# Patient Record
Sex: Female | Born: 1980 | Race: White | Hispanic: No | Marital: Married | Smoking: Current some day smoker
Health system: Southern US, Community
[De-identification: ages and names within clinical notes are randomized; demographics above are authoritative.]

## PROBLEM LIST (undated history)

## (undated) DIAGNOSIS — F419 Anxiety disorder, unspecified: Secondary | ICD-10-CM

## (undated) DIAGNOSIS — F329 Major depressive disorder, single episode, unspecified: Secondary | ICD-10-CM

## (undated) DIAGNOSIS — F32A Depression, unspecified: Secondary | ICD-10-CM

## (undated) DIAGNOSIS — I83813 Varicose veins of bilateral lower extremities with pain: Secondary | ICD-10-CM

## (undated) HISTORY — DX: Varicose veins of bilateral lower extremities with pain: I83.813

## (undated) HISTORY — PX: HERNIA REPAIR: SHX51

## (undated) HISTORY — DX: Major depressive disorder, single episode, unspecified: F32.9

## (undated) HISTORY — DX: Anxiety disorder, unspecified: F41.9

## (undated) HISTORY — PX: TONSILLECTOMY: SUR1361

## (undated) HISTORY — DX: Depression, unspecified: F32.A

---

## 2016-02-26 ENCOUNTER — Other Ambulatory Visit: Payer: Self-pay | Admitting: Adult Health

## 2016-02-26 ENCOUNTER — Ambulatory Visit
Admission: RE | Admit: 2016-02-26 | Discharge: 2016-02-26 | Disposition: A | Discharge status: Home / Self Care | Source: Ambulatory Visit | Attending: Adult Health | Admitting: Adult Health

## 2016-02-26 DIAGNOSIS — M7989 Other specified soft tissue disorders: Secondary | ICD-10-CM | POA: Insufficient documentation

## 2016-04-08 ENCOUNTER — Ambulatory Visit (INDEPENDENT_AMBULATORY_CARE_PROVIDER_SITE_OTHER): Admitting: Vascular Surgery

## 2016-04-08 ENCOUNTER — Encounter (INDEPENDENT_AMBULATORY_CARE_PROVIDER_SITE_OTHER): Payer: Self-pay

## 2016-04-08 ENCOUNTER — Encounter (INDEPENDENT_AMBULATORY_CARE_PROVIDER_SITE_OTHER): Payer: Self-pay | Admitting: Vascular Surgery

## 2016-04-08 VITALS — BP 107/54 | HR 64 | Resp 17 | Ht 62.0 in | Wt 171.0 lb

## 2016-04-08 DIAGNOSIS — I83813 Varicose veins of bilateral lower extremities with pain: Secondary | ICD-10-CM | POA: Diagnosis not present

## 2016-04-08 DIAGNOSIS — F17200 Nicotine dependence, unspecified, uncomplicated: Secondary | ICD-10-CM

## 2016-04-08 DIAGNOSIS — M79609 Pain in unspecified limb: Secondary | ICD-10-CM | POA: Insufficient documentation

## 2016-04-08 DIAGNOSIS — M79604 Pain in right leg: Secondary | ICD-10-CM

## 2016-04-08 NOTE — Assessment & Plan Note (Signed)
The patient has symptoms consistent with chronic venous insufficiency. We discussed the natural history and treatment options for venous disease. I recommended the regular use of 20 - 30 mm Hg compression stockings, and prescribed these today. I recommended leg elevation and anti-inflammatories as needed for pain. I have also recommended a complete venous duplex to assess the venous system for reflux or thrombotic issues if the pain worsens. This can be done at the patient's convenience. I will see the patient back in 3 months to assess the response to conservative management, and determine further treatment options.

## 2016-04-08 NOTE — Progress Notes (Signed)
Patient ID: Andrea CashingSarah Adams, female   DOB: 03/18/1981, 35 y.o.   MRN: [id number]  Chief Complaint  Patient presents with  . New Patient (Initial Visit)    HPI Andrea CashingSarah Adams is a 35 y.o. female.  I am asked to see the patient by Dr. Olena Leatherwoodornetto for evaluation of pain in the legs with varicose veins.  The patient presents with complaints of symptomatic varicosities of the legs. The patient reports a long standing history of varicosities and they have become painful over time. There was no clear inciting event or causative factor that started the symptoms.  The right leg is more severly affected. The patient elevates the legs for relief. The pain is described as aching and heaviness. The symptoms are generally most severe in the evening, particularly when they have been on their feet for long periods of time. Of note, she was recently on several medications which were stopped which seems to have improved her lower extremity symptoms. She had a venous duplex to make sure she did not have a DVT recently, and this was negative. The patient has no previous history of deep venous thrombosis or superficial thrombophlebitis to their knowledge.     History reviewed. No pertinent past medical history.  Past Surgical History:  Procedure Laterality Date  . HERNIA REPAIR    . TONSILLECTOMY      Family History  Problem Relation Age of Onset  . Varicose Veins Mother   . Cancer Father   . AAA (abdominal aortic aneurysm) Father   . Cerebrovascular Accident Maternal Grandfather      Social History Social History  Substance Use Topics  . Smoking status: Current Some Day Smoker    Types: Cigarettes  . Smokeless tobacco: Current User  . Alcohol use Yes     No Known Allergies  Current Outpatient Prescriptions  Medication Sig Dispense Refill  . acetaminophen (TYLENOL) 500 MG tablet Take by mouth as needed.    . Multiple Vitamin (MULTIVITAMIN) LIQD Take 5 mLs by mouth daily.     No current  facility-administered medications for this visit.       REVIEW OF SYSTEMS (Negative unless checked)  Constitutional: [] Weight loss  [] Fever  [] Chills Cardiac: [] Chest pain   [] Chest pressure   [] Palpitations   [] Shortness of breath when laying flat   [] Shortness of breath at rest   [] Shortness of breath with exertion. Vascular:  [] Pain in legs with walking   [] Pain in legs at rest   [] Pain in legs when laying flat   [] Claudication   [] Pain in feet when walking  [] Pain in feet at rest  [] Pain in feet when laying flat   [] History of DVT   [] Phlebitis   [x] Swelling in legs   [x] Varicose veins   [] Non-healing ulcers Pulmonary:   [] Uses home oxygen   [] Productive cough   [] Hemoptysis   [] Wheeze  [] COPD   [] Asthma Neurologic:  [] Dizziness  [] Blackouts   [] Seizures   [] History of stroke   [] History of TIA  [] Aphasia   [] Temporary blindness   [] Dysphagia   [] Weakness or numbness in arms   [] Weakness or numbness in legs Musculoskeletal:  [] Arthritis   [] Joint swelling   [] Joint pain   [] Low back pain Hematologic:  [] Easy bruising  [] Easy bleeding   [] Hypercoagulable state   [] Anemic  [] Hepatitis Gastrointestinal:  [] Blood in stool   [] Vomiting blood  [] Gastroesophageal reflux/heartburn   [] Abdominal pain Genitourinary:  [] Chronic kidney disease   [] Difficult urination  [] Frequent urination  []   Burning with urination   [] Hematuria Skin:  [] Rashes   [] Ulcers   [] Wounds Psychological:  [] History of anxiety   []  History of major depression.    Physical Exam BP (!) 107/54   Pulse 64   Resp 17   Ht 5\' 2"  (1.575 m)   Wt 77.6 kg (171 lb)   BMI 31.28 kg/m  Gen:  WD/WN, NAD Head: [place]/AT, No temporalis wasting.  Ear/Nose/Throat: Hearing grossly intact, dentition  Eyes: Sclera non-icteric. Conjunctiva clear Neck: Supple, no nuchal rigidity. Trachea midline Pulmonary:  Good air movement, no use of accessory muscles, respirations not labored.  Cardiac: RRR, No JVD Vascular: Varicosities in the right  medial calf are the most prominent and measuring up to 4-5 mm in the right lower extremity        Varicosities scattered and measuring up to 2-3 mm in the left lower extremity Vessel Right Left  Radial Palpable Palpable  Ulnar Palpable Palpable  Brachial Palpable Palpable  Carotid Palpable, without bruit Palpable, without bruit  Aorta Not palpable N/A  Femoral Palpable Palpable  Popliteal Palpable Palpable  PT Palpable Palpable  DP Palpable Palpable   Gastrointestinal: soft, non-tender/non-distended. No guarding/reflex. No masses, surgical incisions, or scars. Musculoskeletal: M/S 5/5 throughout.  No significant lower extremity edema Neurologic: CN 2-12 intact. Pain and light touch intact in extremities.  Symmetrical.  Speech is fluent.  Psychiatric: Judgment intact, Mood & affect appropriate for pt's clinical situation. Dermatologic: No rashes or ulcers noted.  No cellulitis or open wounds. Lymph : No Cervical, Axillary, or Inguinal lymphadenopathy.   Radiology No results found.  Labs No results found for this or any previous visit (from the past 2160 hour(s)).  Assessment/Plan:  Pain in limb Improved after stopping several medications.  Varicose veins of bilateral lower extremities with pain The patient has symptoms consistent with chronic venous insufficiency. We discussed the natural history and treatment options for venous disease. I recommended the regular use of 20 - 30 mm Hg compression stockings, and prescribed these today. I recommended leg elevation and anti-inflammatories as needed for pain. I have also recommended a complete venous duplex to assess the venous system for reflux or thrombotic issues if the pain worsens. This can be done at the patient's convenience. I will see the patient back in 3 months to assess the response to conservative management, and determine further treatment options.  As her symptoms have improved with cessation of several medications, we're  going to give her a trial of several months with conservative measures to see if her symptoms worsen or improve before proceeding with any invasive treatment or further workup with duplex. She will call my office with any worsening symptoms in the interim.       Festus Barren 04/08/2016, 4:54 PM   This note was created with Dragon medical transcription system.  Any errors from dictation are unintentional.

## 2016-04-08 NOTE — Assessment & Plan Note (Signed)
Improved after stopping several medications.

## 2016-04-08 NOTE — Patient Instructions (Signed)
Varicose Veins Varicose veins are veins that have become enlarged and twisted. They are usually seen in the legs but can occur in other parts of the body as well. CAUSES This condition is the result of valves in the veins not working properly. Valves in the veins help to return blood from the leg to the heart. If these valves are damaged, blood flows backward and backs up into the veins in the leg near the skin. This causes the veins to become larger. RISK FACTORS People who are on their feet a lot, who are pregnant, or who are overweight are more likely to develop varicose veins. SIGNS AND SYMPTOMS  Bulging, twisted-appearing, bluish veins, most commonly found on the legs.  Leg pain or a feeling of heaviness. These symptoms may be worse at the end of the day.  Leg swelling.  Changes in skin color. DIAGNOSIS A health care provider can usually diagnose varicose veins by examining your legs. Your health care provider may also recommend an ultrasound of your leg veins. TREATMENT Most varicose veins can be treated at home.However, other treatments are available for people who have persistent symptoms or want to improve the cosmetic appearance of the varicose veins. These treatment options include:  Sclerotherapy. A solution is injected into the vein to close it off.  Laser treatment. A laser is used to heat the vein to close it off.  Radiofrequency vein ablation. An electrical current produced by radio waves is used to close off the vein.  Phlebectomy. The vein is surgically removed through small incisions made over the varicose vein.  Vein ligation and stripping. The vein is surgically removed through incisions made over the varicose vein after the vein has been tied (ligated). HOME CARE INSTRUCTIONS  Do not stand or sit in one position for long periods of time. Do not sit with your legs crossed. Rest with your legs raised during the day.  Wear compression stockings as directed by your  health care provider. These stockings help to prevent blood clots and reduce swelling in your legs.  Do not wear other tight, encircling garments around your legs, pelvis, or waist.  Walk as much as possible to increase blood flow.  Raise the foot of your bed at night with 2-inch blocks.  If you get a cut in the skin over the vein and the vein bleeds, lie down with your leg raised and press on it with a clean cloth until the bleeding stops. Then place a bandage (dressing) on the cut. See your health care provider if it continues to bleed. SEEK MEDICAL CARE IF:  The skin around your ankle starts to break down.  You have pain, redness, tenderness, or hard swelling in your leg over a vein.  You are uncomfortable because of leg pain.   This information is not intended to replace advice given to you by your health care provider. Make sure you discuss any questions you have with your health care provider.   Document Released: 03/23/2005 Document Revised: 07/04/2014 Document Reviewed: 10/29/2013 Elsevier Interactive Patient Education 2016 [employer]  

## 2016-06-03 ENCOUNTER — Ambulatory Visit (INDEPENDENT_AMBULATORY_CARE_PROVIDER_SITE_OTHER): Admitting: Vascular Surgery

## 2016-06-24 ENCOUNTER — Encounter (INDEPENDENT_AMBULATORY_CARE_PROVIDER_SITE_OTHER): Payer: Self-pay | Admitting: Vascular Surgery

## 2016-06-24 ENCOUNTER — Ambulatory Visit (INDEPENDENT_AMBULATORY_CARE_PROVIDER_SITE_OTHER): Admitting: Vascular Surgery

## 2016-06-24 VITALS — BP 106/65 | HR 67 | Resp 17 | Wt 177.0 lb

## 2016-06-24 DIAGNOSIS — M79604 Pain in right leg: Secondary | ICD-10-CM

## 2016-06-24 DIAGNOSIS — I83811 Varicose veins of right lower extremities with pain: Secondary | ICD-10-CM | POA: Diagnosis not present

## 2016-06-24 NOTE — Assessment & Plan Note (Signed)
Suspicious for venous disease.  See plan as below

## 2016-06-24 NOTE — Assessment & Plan Note (Signed)
No improvement with conservative management. Would like to proceed with a venous duplex for further evaluation and planning of treatment for venous disease.

## 2016-06-24 NOTE — Progress Notes (Signed)
MRN : [id number]  Andrea Adams is a 35 y.o. (06/28/1980) female who presents with chief complaint of  Chief Complaint  Patient presents with  . Follow-up  .  History of Present Illness: Patient returns today in follow up of Leg pain and venous disease. She reports that she has been trying to wear her compression stockings, elevating her legs, exercising and doing all things that we asked her to do at her first visit about 10 weeks ago. Her right leg is not really any better and the veins are still just as prominent. She now wants further evaluation for her venous disease.   History reviewed. No pertinent past medical history.       Past Surgical History:  Procedure Laterality Date  . HERNIA REPAIR    . TONSILLECTOMY           Family History  Problem Relation Age of Onset  . Varicose Veins Mother   . Cancer Father   . AAA (abdominal aortic aneurysm) Father   . Cerebrovascular Accident Maternal Grandfather      Social History       Social History  Substance Use Topics  . Smoking status: Current Some Day Smoker    Types: Cigarettes  . Smokeless tobacco: Current User  . Alcohol use Yes      No Known Allergies        Current Outpatient Prescriptions  Medication Sig Dispense Refill  . acetaminophen (TYLENOL) 500 MG tablet Take by mouth as needed.    . Multiple Vitamin (MULTIVITAMIN) LIQD Take 5 mLs by mouth daily.     No current facility-administered medications for this visit.       REVIEW OF SYSTEMS (Negative unless checked)  Constitutional: [] Weight loss  [] Fever  [] Chills Cardiac: [] Chest pain   [] Chest pressure   [] Palpitations   [] Shortness of breath when laying flat   [] Shortness of breath at rest   [] Shortness of breath with exertion. Vascular:  [] Pain in legs with walking   [] Pain in legs at rest   [] Pain in legs when laying flat   [] Claudication   [] Pain in feet when walking  [] Pain in feet at rest  [] Pain in feet when  laying flat   [] History of DVT   [] Phlebitis   [x] Swelling in legs   [x] Varicose veins   [] Non-healing ulcers Pulmonary:   [] Uses home oxygen   [] Productive cough   [] Hemoptysis   [] Wheeze  [] COPD   [] Asthma Neurologic:  [] Dizziness  [] Blackouts   [] Seizures   [] History of stroke   [] History of TIA  [] Aphasia   [] Temporary blindness   [] Dysphagia   [] Weakness or numbness in arms   [] Weakness or numbness in legs Musculoskeletal:  [] Arthritis   [] Joint swelling   [] Joint pain   [] Low back pain Hematologic:  [] Easy bruising  [] Easy bleeding   [] Hypercoagulable state   [] Anemic  [] Hepatitis Gastrointestinal:  [] Blood in stool   [] Vomiting blood  [] Gastroesophageal reflux/heartburn   [] Abdominal pain Genitourinary:  [] Chronic kidney disease   [] Difficult urination  [] Frequent urination  [] Burning with urination   [] Hematuria Skin:  [] Rashes   [] Ulcers   [] Wounds Psychological:  [] History of anxiety   []  History of major depression.    Physical Exam BP (!) 107/54   Pulse 64   Resp 17   Ht 5\' 2"  (1.575 m)   Wt 77.6 kg (171 lb)   BMI 31.28 kg/m  Gen:  WD/WN, NAD Head: [place]/AT, No temporalis wasting.  Ear/Nose/Throat: Hearing grossly intact, dentition  Eyes: Sclera non-icteric. Conjunctiva clear Neck: Supple, no nuchal rigidity. Trachea midline Pulmonary:  Good air movement, no use of accessory muscles, respirations not labored.  Cardiac: RRR, No JVD Vascular: Varicosities in the right medial calf are the most prominent and measuring up to 4-5 mm in the right lower extremity                   Varicosities scattered and measuring up to 2-3 mm in the left lower extremity Vessel Right Left  Radial Palpable Palpable  Ulnar Palpable Palpable  Brachial Palpable Palpable  Carotid Palpable, without bruit Palpable, without bruit  Aorta Not palpable N/A  Femoral Palpable Palpable  Popliteal Palpable Palpable  PT Palpable Palpable  DP Palpable Palpable   Gastrointestinal: soft,  non-tender/non-distended. No guarding/reflex. No masses, surgical incisions, or scars. Musculoskeletal: M/S 5/5 throughout.  No significant lower extremity edema Neurologic: CN 2-12 intact. Pain and light touch intact in extremities.  Symmetrical.  Speech is fluent.  Psychiatric: Judgment intact, Mood & affect appropriate for pt's clinical situation. Dermatologic: No rashes or ulcers noted.  No cellulitis or open wounds. Lymph : No Cervical, Axillary, or Inguinal lymphadenopathy.     Labs No results found for this or any previous visit (from the past 2160 hour(s)).  Radiology No results found.   Assessment/Plan  Pain in limb Suspicious for venous disease.  See plan as below  Varicose veins of leg with pain, right No improvement with conservative management. Would like to proceed with a venous duplex for further evaluation and planning of treatment for venous disease.    Festus BarrenJason Kieryn Burtis, MD  06/24/2016 4:52 PM    This note was created with Dragon medical transcription system.  Any errors from dictation are purely unintentional

## 2016-07-20 ENCOUNTER — Ambulatory Visit (INDEPENDENT_AMBULATORY_CARE_PROVIDER_SITE_OTHER)

## 2016-07-20 ENCOUNTER — Ambulatory Visit (INDEPENDENT_AMBULATORY_CARE_PROVIDER_SITE_OTHER): Admitting: Vascular Surgery

## 2016-07-20 ENCOUNTER — Encounter (INDEPENDENT_AMBULATORY_CARE_PROVIDER_SITE_OTHER): Payer: Self-pay | Admitting: Vascular Surgery

## 2016-07-20 VITALS — BP 114/72 | HR 74 | Resp 16 | Wt 178.0 lb

## 2016-07-20 DIAGNOSIS — I83813 Varicose veins of bilateral lower extremities with pain: Secondary | ICD-10-CM | POA: Diagnosis not present

## 2016-07-20 DIAGNOSIS — I83811 Varicose veins of right lower extremities with pain: Secondary | ICD-10-CM

## 2016-07-20 DIAGNOSIS — F17200 Nicotine dependence, unspecified, uncomplicated: Secondary | ICD-10-CM

## 2016-07-20 DIAGNOSIS — I872 Venous insufficiency (chronic) (peripheral): Secondary | ICD-10-CM | POA: Diagnosis not present

## 2016-07-20 NOTE — Progress Notes (Signed)
Subjective:    Patient ID: Andrea Adams, female    DOB: 04/13/1981, 36 y.o.   MRN: [id number] Chief Complaint  Patient presents with  . Follow-up   Patient presents to review vascular studies. She was last seen on 04/08/16 for evaluation of right lower extremity painful varicose veins. The patient reports a long standing history of varicosities and they have become progressively painful over time.  There was no clear inciting event or causative factor that started the symptoms. The pain is described as aching and heaviness. She notes a swelling associated with the varicosities. The swelling is mostly located in her right calf. The symptoms are generally most severe in the evening, particularly when she has been on her feet for long periods of time. Over the last three months, the patient has been wearing medical grade one compression stockings, elevating her legs and remaining active with minimal relief in symptoms requiring the use of OTC anti-inflammatories. Her symptoms have now started to effect her ability to function on a daily basis. Today, the patient underwent a right lower extremity venous duplex which was notable for No DVT, No SVT and reflux of the GSV.    Review of Systems  Constitutional: Negative.   HENT: Negative.   Eyes: Negative.   Respiratory: Negative.   Cardiovascular: Positive for leg swelling (Right Lower Extremity).       Right Lower Extremity Painful Varicose Veins  Gastrointestinal: Negative.   Endocrine: Negative.   Genitourinary: Negative.   Musculoskeletal: Negative.   Skin: Negative.   Allergic/Immunologic: Negative.   Neurological: Negative.   Hematological: Negative.   Psychiatric/Behavioral: Negative.       Objective:   Physical Exam Gen:  WD/WN, NAD Head: /AT, No temporalis wasting.  Ear/Nose/Throat: Hearing grossly intact, dentition  Eyes: Sclera non-icteric. Conjunctiva clear Neck: Supple, no nuchal rigidity. Trachea midline Pulmonary:   Good air movement, no use of accessory muscles, respirations not labored.  Cardiac: RRR, No JVD Vascular: Varicosities in the right medial calf are the most prominent and measuring up to 4-5 mm in the right lower extremity. Varicosities scattered and measuring up to 2-3 mm in the left lower extremity. Vessel Right Left  Radial Palpable Palpable  Ulnar Palpable Palpable  Brachial Palpable Palpable  Carotid Palpable, without bruit Palpable, without bruit  Aorta Not palpable N/A  Femoral Palpable Palpable  Popliteal Palpable Palpable  PT Palpable Palpable  DP Palpable Palpable   Gastrointestinal: soft, non-tender/non-distended. No guarding/reflex. No masses, surgical incisions, or scars. Musculoskeletal: M/S 5/5 throughout.  No significant lower extremity edema Neurologic: CN 2-12 intact. Pain and light touch intact in extremities.  Symmetrical.  Speech is fluent.  Psychiatric: Judgment intact, Mood & affect appropriate for pt's clinical situation. Dermatologic: No rashes or ulcers noted.  No cellulitis or open wounds. Lymph : No Cervical, Axillary, or Inguinal lymphadenopathy  BP 114/72   Pulse 74   Resp 16   Wt 178 lb (80.7 kg)   BMI 32.56 kg/m   No past medical history on file.  Social History   Social History  . Marital status: Married    Spouse name: N/A  . Number of children: N/A  . Years of education: N/A   Occupational History  . Not on file.   Social History Main Topics  . Smoking status: Current Some Day Smoker    Types: Cigarettes  . Smokeless tobacco: Current User  . Alcohol use Yes  . Drug use: No  . Sexual activity: Not on file  Other Topics Concern  . Not on file   Social History Narrative  . No narrative on file    Past Surgical History:  Procedure Laterality Date  . HERNIA REPAIR    . TONSILLECTOMY      Family History  Problem Relation Age of Onset  . Varicose Veins Mother   . Cancer Father   . AAA (abdominal aortic aneurysm) Father     . Cerebrovascular Accident Maternal Grandfather     No Known Allergies     Assessment & Plan:  Patient presents to review vascular studies. She was last seen on 04/08/16 for evaluation of right lower extremity painful varicose veins. The patient reports a long standing history of varicosities and they have become progressively painful over time.  There was no clear inciting event or causative factor that started the symptoms. The pain is described as aching and heaviness. She notes a swelling associated with the varicosities. The swelling is mostly located in her right calf. The symptoms are generally most severe in the evening, particularly when she has been on her feet for long periods of time. Over the last three months, the patient has been wearing medical grade one compression stockings, elevating her legs and remaining active with minimal relief in symptoms requiring the use of OTC anti-inflammatories. Her symptoms have now started to effect her ability to function on a daily basis. Today, the patient underwent a right lower extremity venous duplex which was notable for No DVT, No SVT and reflux of the GSV.   1. Chronic venous insufficiency - New Patient has failed conservative therapy. Patient is symptomatic to where her ability to function on a daily is now being effected. The patient is likely to benefit from endovenous laser ablation. I have discussed the risks and benefits of the procedure. The risks primarily include DVT, recanalization, bleeding, infection, and inability to gain access. Continue compression and elevation for now until insurance approval is received.  2. Tobacco use disorder - Stable I have discussed (approximately 5 minutes) with the patient the role of tobacco in the pathogenesis of atherosclerosis and its effect on the progression of the disease, impact on the durability of interventions and its limitations on the formation of collateral pathways. I have recommended  absolute tobacco cessation. I have discussed various options available for assistance with tobacco cessation including over the counter methods (Nicotine gum, patch and lozenges). We also discussed prescription options (Chantix, Nicotine Inhaler / Nasal Spray). The patient is not interested in pursuing any prescription tobacco cessation options at this time. The patient voices their understanding.   3. Varicose veins of bilateral lower extremities with pain - Worsening Patient has failed conservative therapy. Patient is symptomatic to where her ability to function on a daily is now being effected. The patient is likely to benefit from endovenous laser ablation. I have discussed the risks and benefits of the procedure. The risks primarily include DVT, recanalization, bleeding, infection, and inability to gain access. Continue compression and elevation for now until insurance approval is received.  Current Outpatient Prescriptions on File Prior to Visit  Medication Sig Dispense Refill  . acetaminophen (TYLENOL) 500 MG tablet Take by mouth as needed.    . Multiple Vitamin (MULTIVITAMIN) LIQD Take 5 mLs by mouth daily.     No current facility-administered medications on file prior to visit.     There are no Patient Instructions on file for this visit. No Follow-up on file.   Jenney Brester A Arijana Narayan, PA-C

## 2016-09-16 ENCOUNTER — Ambulatory Visit (INDEPENDENT_AMBULATORY_CARE_PROVIDER_SITE_OTHER): Admitting: Vascular Surgery

## 2016-10-14 ENCOUNTER — Other Ambulatory Visit (INDEPENDENT_AMBULATORY_CARE_PROVIDER_SITE_OTHER): Admitting: Vascular Surgery

## 2016-10-17 ENCOUNTER — Encounter (INDEPENDENT_AMBULATORY_CARE_PROVIDER_SITE_OTHER)

## 2016-10-19 ENCOUNTER — Encounter (INDEPENDENT_AMBULATORY_CARE_PROVIDER_SITE_OTHER)

## 2016-11-08 ENCOUNTER — Encounter (INDEPENDENT_AMBULATORY_CARE_PROVIDER_SITE_OTHER): Payer: Self-pay | Admitting: Vascular Surgery

## 2016-11-08 ENCOUNTER — Ambulatory Visit (INDEPENDENT_AMBULATORY_CARE_PROVIDER_SITE_OTHER): Admitting: Vascular Surgery

## 2016-11-08 VITALS — BP 111/78 | HR 78 | Resp 16 | Wt 165.0 lb

## 2016-11-08 DIAGNOSIS — I83813 Varicose veins of bilateral lower extremities with pain: Secondary | ICD-10-CM

## 2016-11-08 NOTE — Progress Notes (Signed)
Varicose veins of bilateral lower extremities with pain See laser note below   The patient's right lower extremity was sterilely prepped and draped. The ultrasound machine was used to visualize the saphenous vein throughout its course. A segment in the upper calf was selected for access. The saphenous vein was accessed without difficulty using ultrasound guidance with a micro puncture needle. A micro puncture wire and sheath were then placed. A 0.018 wire was placed beyond the saphenofemoral junction through the sheath and the micro puncture sheath was removed. The 65 cm sheath was then placed over the wire and the wire and dilator were removed. The laser fiber was placed through the sheath and its tip was placed approximately 4-5 cm below the saphenofemoral junction. Tumescent anesthesia was then created with a dilute lidocaine solution. Laser energy was then delivered with constant withdrawal of the sheath and laser fiber. Approximately 1458 Joules of energy were delivered over a length of 41 cm using the 1470 Hz [employer] machine at [employer]. Sterile dressings were placed. The patient tolerated the procedure well without complications.

## 2016-11-08 NOTE — Assessment & Plan Note (Signed)
See laser note below

## 2016-11-14 ENCOUNTER — Ambulatory Visit (INDEPENDENT_AMBULATORY_CARE_PROVIDER_SITE_OTHER)

## 2016-11-14 DIAGNOSIS — I83813 Varicose veins of bilateral lower extremities with pain: Secondary | ICD-10-CM

## 2016-11-18 ENCOUNTER — Other Ambulatory Visit (INDEPENDENT_AMBULATORY_CARE_PROVIDER_SITE_OTHER): Admitting: Vascular Surgery

## 2016-11-22 ENCOUNTER — Encounter (INDEPENDENT_AMBULATORY_CARE_PROVIDER_SITE_OTHER)

## 2016-12-06 ENCOUNTER — Ambulatory Visit (INDEPENDENT_AMBULATORY_CARE_PROVIDER_SITE_OTHER): Admitting: Vascular Surgery

## 2016-12-06 ENCOUNTER — Encounter (INDEPENDENT_AMBULATORY_CARE_PROVIDER_SITE_OTHER): Payer: Self-pay | Admitting: Vascular Surgery

## 2016-12-06 VITALS — BP 112/72 | HR 61 | Resp 17 | Ht 62.0 in | Wt 166.0 lb

## 2016-12-06 DIAGNOSIS — I83813 Varicose veins of bilateral lower extremities with pain: Secondary | ICD-10-CM

## 2016-12-06 DIAGNOSIS — I872 Venous insufficiency (chronic) (peripheral): Secondary | ICD-10-CM | POA: Diagnosis not present

## 2016-12-06 NOTE — Progress Notes (Signed)
Subjective:    Patient ID: Andrea Adams, female    DOB: May 14, 1981, 36 y.o.   MRN: [id number] Chief Complaint  Patient presents with  . Venous Insufficiency    3-4 week post laser   Patient presents for her first post procedure follow-up. She is status post a right lower extremity laser ablation on 11/08/2016. Her post-procedure course has been uneventful. Patient continues to her medical grade 1 compression stockings, engage in elevation of her lower extremity and remain active. The patient presents today complaining of continued pain along her varicose veins located in the right lower extremity. The patient has experienced minimal improvement in the size and tenderness of her varicose veins status post right greater saphenous vein ablation. Patient's access site is healing well. She denies any fever, nausea or vomiting.    Review of Systems  Constitutional: Negative.   HENT: Negative.   Eyes: Negative.   Respiratory: Negative.   Cardiovascular: Positive for leg swelling.  Gastrointestinal: Negative.   Endocrine: Negative.   Genitourinary: Negative.   Musculoskeletal: Negative.   Skin: Negative.   Allergic/Immunologic: Negative.   Neurological: Negative.   Hematological: Negative.   Psychiatric/Behavioral: Negative.       Objective:   Physical Exam  Constitutional: She is oriented to person, place, and time. She appears well-developed and well-nourished. No distress.  HENT:  Head: Normocephalic and atraumatic.  Eyes: Conjunctivae are normal. Pupils are equal, round, and reactive to light.  Neck: Normal range of motion.  Cardiovascular: Normal rate, regular rhythm, normal heart sounds and intact distal pulses.   Pulses:      Radial pulses are 2+ on the right side, and 2+ on the left side.       Dorsalis pedis pulses are 2+ on the right side, and 2+ on the left side.       Posterior tibial pulses are 2+ on the right side, and 2+ on the left side.  Pulmonary/Chest: Effort  normal.  Musculoskeletal: Normal range of motion. She exhibits edema (Mild right lower extremity edema noted).  Neurological: She is alert and oriented to person, place, and time.  Skin: Skin is warm and dry. She is not diaphoretic.  Access site is healed. Patient is noted to have <1cm scattered varicosities along the right lower extremity  Psychiatric: She has a normal mood and affect. Her behavior is normal. Judgment and thought content normal.    BP 112/72 (BP Location: Right Arm)   Pulse 61   Resp 17   Ht 5\' 2"  (1.575 m)   Wt 166 lb (75.3 kg)   BMI 30.36 kg/m   No past medical history on file.  Social History   Social History  . Marital status: Married    Spouse name: N/A  . Number of children: N/A  . Years of education: N/A   Occupational History  . Not on file.   Social History Main Topics  . Smoking status: Current Some Day Smoker    Types: Cigarettes  . Smokeless tobacco: Current User  . Alcohol use Yes  . Drug use: No  . Sexual activity: Not on file   Other Topics Concern  . Not on file   Social History Narrative  . No narrative on file    Past Surgical History:  Procedure Laterality Date  . HERNIA REPAIR    . TONSILLECTOMY      Family History  Problem Relation Age of Onset  . Varicose Veins Mother   . Cancer Father   .  AAA (abdominal aortic aneurysm) Father   . Cerebrovascular Accident Maternal Grandfather     No Known Allergies     Assessment & Plan:  Patient presents for her first post procedure follow-up. She is status post a right lower extremity laser ablation on 11/08/2016. Her post-procedure course has been uneventful. Patient continues to her medical grade 1 compression stockings, engage in elevation of her lower extremity and remain active. The patient presents today complaining of continued pain along her varicose veins located in the right lower extremity. The patient has experienced minimal improvement in the size and tenderness of  her varicose veins status post right greater saphenous vein ablation. Patient's access site is healing well. She denies any fever, nausea or vomiting.  1. Chronic venous insufficiency - stable She is status post a right lower extremity greater saphenous vein ablation on 11/08/2016 Patient with minimal improvement to the painful varicose veins in her right lower extremity Patient continues to engage in conservative therapy. Patient would benefit from saline sclerotherapy into the painful varicose veins located in her right lower extremity Procedure, risks and benefits explained to the patient All questions  answered Patient is willing to proceed We'll apply  2. Varicose veins of bilateral lower extremities with pain - stable As above   Current Outpatient Prescriptions on File Prior to Visit  Medication Sig Dispense Refill  . acetaminophen (TYLENOL) 500 MG tablet Take by mouth as needed.    . Multiple Vitamin (MULTIVITAMIN) LIQD Take 5 mLs by mouth daily.     No current facility-administered medications on file prior to visit.     There are no Patient Instructions on file for this visit. No Follow-up on file.   Melaysia Streed A Anum Palecek, PA-C

## 2016-12-20 ENCOUNTER — Encounter (INDEPENDENT_AMBULATORY_CARE_PROVIDER_SITE_OTHER): Payer: Self-pay | Admitting: Vascular Surgery

## 2016-12-20 ENCOUNTER — Ambulatory Visit (INDEPENDENT_AMBULATORY_CARE_PROVIDER_SITE_OTHER): Admitting: Vascular Surgery

## 2016-12-20 VITALS — BP 123/88 | HR 85 | Resp 16 | Ht 63.0 in | Wt 160.0 lb

## 2016-12-20 DIAGNOSIS — I83813 Varicose veins of bilateral lower extremities with pain: Secondary | ICD-10-CM | POA: Diagnosis not present

## 2016-12-20 NOTE — Progress Notes (Signed)
Varicose veins of right  lower extremity with inflammation (454.1  I83.10) Current Plans   Indication: Patient presents with symptomatic varicose veins of the right  lower extremity.   Procedure: Sclerotherapy using hypertonic saline mixed with 1% Lidocaine was performed on the right lower extremity. Compression wraps were placed. The patient tolerated the procedure well. 

## 2017-04-07 ENCOUNTER — Encounter (INDEPENDENT_AMBULATORY_CARE_PROVIDER_SITE_OTHER): Payer: Self-pay | Admitting: Vascular Surgery

## 2017-04-07 ENCOUNTER — Ambulatory Visit (INDEPENDENT_AMBULATORY_CARE_PROVIDER_SITE_OTHER): Admitting: Vascular Surgery

## 2017-04-07 VITALS — BP 105/70 | HR 82 | Resp 16 | Ht 61.0 in | Wt 149.0 lb

## 2017-04-07 DIAGNOSIS — I83813 Varicose veins of bilateral lower extremities with pain: Secondary | ICD-10-CM | POA: Diagnosis not present

## 2017-04-07 NOTE — Progress Notes (Signed)
Andrea Adams is a 36 y.o.female who presents with painful varicose veins of the right leg  No past medical history on file.  Past Surgical History:  Procedure Laterality Date  . HERNIA REPAIR    . TONSILLECTOMY      Current Outpatient Prescriptions  Medication Sig Dispense Refill  . acetaminophen (TYLENOL) 500 MG tablet Take by mouth as needed.    . Multiple Vitamin (MULTIVITAMIN) LIQD Take 5 mLs by mouth daily.    . phentermine (ADIPEX-P) 37.5 MG tablet TK 1 T PO QD  0   No current facility-administered medications for this visit.     No Known Allergies  Indication: Patient presents with symptomatic varicose veins of the right lower extremity.  Procedure: Foam sclerotherapy was performed on the right lower extremity. Using ultrasound guidance, 5 mL of foam Sotradecol was used to inject the varicosities of the right lower extremity. Compression wraps were placed. The patient tolerated the procedure well.

## 2017-05-30 ENCOUNTER — Encounter (INDEPENDENT_AMBULATORY_CARE_PROVIDER_SITE_OTHER): Payer: Self-pay | Admitting: Vascular Surgery

## 2017-05-30 ENCOUNTER — Ambulatory Visit (INDEPENDENT_AMBULATORY_CARE_PROVIDER_SITE_OTHER): Admitting: Vascular Surgery

## 2017-05-30 VITALS — BP 117/82 | HR 89 | Resp 18 | Wt 145.0 lb

## 2017-05-30 DIAGNOSIS — I83813 Varicose veins of bilateral lower extremities with pain: Secondary | ICD-10-CM

## 2017-05-30 DIAGNOSIS — I83811 Varicose veins of right lower extremities with pain: Secondary | ICD-10-CM | POA: Diagnosis not present

## 2017-05-30 NOTE — Progress Notes (Signed)
Varicose veins of right  lower extremity with inflammation (454.1  I83.10) Current Plans   Indication: Patient presents with symptomatic varicose veins of the right  lower extremity.   Procedure: Sclerotherapy using hypertonic saline mixed with 1% Lidocaine was performed on the right lower extremity. Compression wraps were placed. The patient tolerated the procedure well. 

## 2017-06-16 ENCOUNTER — Ambulatory Visit (INDEPENDENT_AMBULATORY_CARE_PROVIDER_SITE_OTHER): Admitting: Vascular Surgery

## 2017-09-19 ENCOUNTER — Ambulatory Visit (INDEPENDENT_AMBULATORY_CARE_PROVIDER_SITE_OTHER): Admitting: Vascular Surgery

## 2017-09-19 ENCOUNTER — Encounter (INDEPENDENT_AMBULATORY_CARE_PROVIDER_SITE_OTHER): Payer: Self-pay | Admitting: Vascular Surgery

## 2017-09-19 VITALS — BP 112/62 | HR 64 | Resp 16 | Ht 61.0 in | Wt 143.6 lb

## 2017-09-19 DIAGNOSIS — I872 Venous insufficiency (chronic) (peripheral): Secondary | ICD-10-CM

## 2017-09-19 DIAGNOSIS — I83813 Varicose veins of bilateral lower extremities with pain: Secondary | ICD-10-CM | POA: Diagnosis not present

## 2017-09-19 DIAGNOSIS — M79604 Pain in right leg: Secondary | ICD-10-CM | POA: Diagnosis not present

## 2017-09-19 NOTE — Progress Notes (Signed)
Subjective:    Patient ID: Andrea Adams, female    DOB: [birth year], 37 y.o.   MRN: [id number] Chief Complaint  Patient presents with  . Follow-up    having pain with right leg   Patient presents with a one week worsening of right lower extremity pain.  Patient notes her discomfort is worse at night.  The patient is status post a right lower extremity laser ablation on 11/08/2016.  The patient is experiencing a throbbing and tingling to the right calf.  The patient is very concerned about a DVT.  The patient states that she has been wearing her medical grade one compression socks and elevating her legs with minimal improvement to her symptoms.  The patient denies any chest pain or shortness of breath.  The patient was offered a right lower extremity DVT study on March 28 at 8:30 in the morning however she was unable to attend this appointment and was scheduled for the first week in April.  We agreed to place her on a cancellation list and she will be called if an opening occurs sooner.  The patient also has the option of being assessed at the emergency department.  Review of Systems  Constitutional: Negative.   HENT: Negative.   Eyes: Negative.   Respiratory: Negative.   Cardiovascular: Positive for leg swelling.       Right lower extremity pain  Gastrointestinal: Negative.   Endocrine: Negative.   Genitourinary: Negative.   Musculoskeletal: Negative.   Skin: Negative.   Allergic/Immunologic: Negative.   Neurological: Negative.   Hematological: Negative.   Psychiatric/Behavioral: Negative.       Objective:   Physical Exam  Constitutional: She is oriented to person, place, and time. She appears well-developed and well-nourished. No distress.  HENT:  Head: Normocephalic and atraumatic.  Eyes: Pupils are equal, round, and reactive to light. Conjunctivae are normal.  Neck: Normal range of motion.  Cardiovascular: Normal rate, regular rhythm, normal heart sounds and intact distal  pulses.  Pulses:      Radial pulses are 2+ on the right side, and 2+ on the left side.       Dorsalis pedis pulses are 2+ on the right side, and 2+ on the left side.       Posterior tibial pulses are 2+ on the right side, and 2+ on the left side.  Right lower extremity: Nontender to palpation.  There is no pain with dorsiflexion.  There is no erythema to the leg.  Skin is intact.  There is minimal edema.  Pulmonary/Chest: Effort normal and breath sounds normal.  Musculoskeletal: Normal range of motion.  Neurological: She is alert and oriented to person, place, and time.  Skin: Skin is warm and dry. She is not diaphoretic.  Psychiatric: She has a normal mood and affect. Her behavior is normal. Judgment and thought content normal.  Vitals reviewed.  BP 112/62 (BP Location: Right Arm)   Pulse 64   Resp 16   Ht  (1.549 m)   Wt 143 lb 9.6 oz (65.1 kg)   BMI 27.13 kg/m   Past Medical History:  Diagnosis Date  . Anxiety   . Depression   . Varicose veins of both lower extremities with pain    Social History   Socioeconomic History  . Marital status: Married    Spouse name: Not on file  . Number of children: Not on file  . Years of education: Not on file  . Highest education level: Not  on file  Occupational History  . Not on file  Social Needs  . Financial resource strain: Not on file  . Food insecurity:    Worry: Not on file    Inability: Not on file  . Transportation needs:    Medical: Not on file    Non-medical: Not on file  Tobacco Use  . Smoking status: Current Some Day Smoker    Types: Cigarettes  . Smokeless tobacco: Current User  Substance and Sexual Activity  . Alcohol use: Yes  . Drug use: No  . Sexual activity: Not on file  Lifestyle  . Physical activity:    Days per week: Not on file    Minutes per session: Not on file  . Stress: Not on file  Relationships  . Social connections:    Talks on phone: Not on file    Gets together: Not on file     Attends religious service: Not on file    Active member of club or organization: Not on file    Attends meetings of clubs or organizations: Not on file    Relationship status: Not on file  . Intimate partner violence:    Fear of current or ex partner: Not on file    Emotionally abused: Not on file    Physically abused: Not on file    Forced sexual activity: Not on file  Other Topics Concern  . Not on file  Social History Narrative  . Not on file   Past Surgical History:  Procedure Laterality Date  . HERNIA REPAIR    . TONSILLECTOMY     Family History  Problem Relation Age of Onset  . Varicose Veins Mother   . Cancer Father   . AAA (abdominal aortic aneurysm) Father   . Cerebrovascular Accident Maternal Grandfather    No Known Allergies     Assessment & Plan:  Patient presents with a one week worsening of right lower extremity pain.  Patient notes her discomfort is worse at night.  The patient is status post a right lower extremity laser ablation on 11/08/2016.  The patient is experiencing a throbbing and tingling to the right calf.  The patient is very concerned about a DVT.  The patient states that she has been wearing her medical grade one compression socks and elevating her legs with minimal improvement to her symptoms.  The patient denies any chest pain or shortness of breath.  The patient was offered a right lower extremity DVT study on March 28 at 8:30 in the morning however she was unable to attend this appointment and was scheduled for the first week in April.  We agreed to place her on a cancellation list and she will be called if an opening occurs sooner.  The patient also has the option of being assessed at the emergency department.  1. Chronic venous insufficiency - Stable The patient is to continue wearing her medical grade 1 compression stockings and elevating her legs I will bring the patient back as soon as possible to undergo a right lower extremity venous reflux  to rule out recannulation of her previously ablated saphenous vein and DVT. I have a low threshold based on physical exam for a DVT however if the patient does feel strongly and is unable to make the September 21, 2017 8:30 AM appointment she can always be assessed at [facility] ED where the ED if her choice If the patient is to experience any shortness of breath  or chest pain she should seek medical attention immediately The patient will be placed on a cancellation list The patient expresses her understanding  2. Varicose veins of bilateral lower extremities with pain - Stable As above  3. Pain of right lower extremity - Stable As above   - VAS [place] LOWER EXTREMITY VENOUS REFLUX; Future  Current Outpatient Medications on File Prior to Visit  Medication Sig Dispense Refill  . acetaminophen (TYLENOL) 500 MG tablet Take by mouth as needed.    . ALPRAZolam (XANAX) 0.25 MG tablet Take by mouth as needed.     Marland Kitchen buPROPion (WELLBUTRIN XL) 150 MG 24 hr tablet Take by mouth.    . Multiple Vitamin (MULTIVITAMIN) LIQD Take 5 mLs by mouth daily.    . phentermine (ADIPEX-P) 37.5 MG tablet TK 1 T PO QD  0   No current facility-administered medications on file prior to visit.    There are no Patient Instructions on file for this visit. No follow-ups on file.  Preslee Regas A Othal Kubitz, PA-C

## 2017-09-29 ENCOUNTER — Encounter (INDEPENDENT_AMBULATORY_CARE_PROVIDER_SITE_OTHER): Payer: Self-pay

## 2017-09-29 ENCOUNTER — Ambulatory Visit (INDEPENDENT_AMBULATORY_CARE_PROVIDER_SITE_OTHER)

## 2017-09-29 ENCOUNTER — Ambulatory Visit (INDEPENDENT_AMBULATORY_CARE_PROVIDER_SITE_OTHER): Admitting: Vascular Surgery

## 2017-09-29 DIAGNOSIS — M79604 Pain in right leg: Secondary | ICD-10-CM | POA: Diagnosis not present

## 2017-09-29 DIAGNOSIS — I83813 Varicose veins of bilateral lower extremities with pain: Secondary | ICD-10-CM

## 2017-09-29 NOTE — Progress Notes (Signed)
Patient left before being seen.

## 2018-02-27 ENCOUNTER — Encounter (INDEPENDENT_AMBULATORY_CARE_PROVIDER_SITE_OTHER): Payer: Self-pay | Admitting: Vascular Surgery

## 2018-02-27 ENCOUNTER — Ambulatory Visit (INDEPENDENT_AMBULATORY_CARE_PROVIDER_SITE_OTHER): Admitting: Vascular Surgery

## 2018-02-27 VITALS — BP 115/72 | HR 73 | Resp 12 | Ht 62.0 in | Wt 135.0 lb

## 2018-02-27 DIAGNOSIS — I83813 Varicose veins of bilateral lower extremities with pain: Secondary | ICD-10-CM

## 2018-02-27 DIAGNOSIS — I83811 Varicose veins of right lower extremities with pain: Secondary | ICD-10-CM

## 2018-02-27 NOTE — Progress Notes (Signed)
Andrea Adams is a 37 y.o.female who presents with painful varicose veins of the right leg  Past Medical History:  Diagnosis Date  . Anxiety   . Depression   . Varicose veins of both lower extremities with pain     Past Surgical History:  Procedure Laterality Date  . HERNIA REPAIR    . TONSILLECTOMY      Current Outpatient Medications  Medication Sig Dispense Refill  . atorvastatin (LIPITOR) 10 MG tablet Take by mouth.    . busPIRone (BUSPAR) 5 MG tablet Take by mouth.    Marland Kitchen acetaminophen (TYLENOL) 500 MG tablet Take by mouth as needed.    . ALPRAZolam (XANAX) 0.25 MG tablet Take by mouth as needed.     Marland Kitchen buPROPion (WELLBUTRIN XL) 150 MG 24 hr tablet Take by mouth.    . Multiple Vitamin (MULTIVITAMIN) LIQD Take 5 mLs by mouth daily.    . phentermine (ADIPEX-P) 37.5 MG tablet TK 1 T PO QD  0   No current facility-administered medications for this visit.     No Known Allergies  Indication: Patient presents with symptomatic varicose veins of the right lower extremity.  Procedure: Foam sclerotherapy was performed on the right lower extremity. Using ultrasound guidance, 5 mL of foam Sotradecol was used to inject the varicosities of the right lower extremity. Compression wraps were placed. The patient tolerated the procedure well.

## 2018-03-24 IMAGING — US US EXTREM LOW VENOUS*R*
1 series · 13 of 24 positions shown · non-contrast
Comparison: None.

CLINICAL DATA: Right lower extremity edema.



[Series 1: us extrem low venous*right* · 0.06mm/px · 13 of 38 slices shown]
[im 1/38]
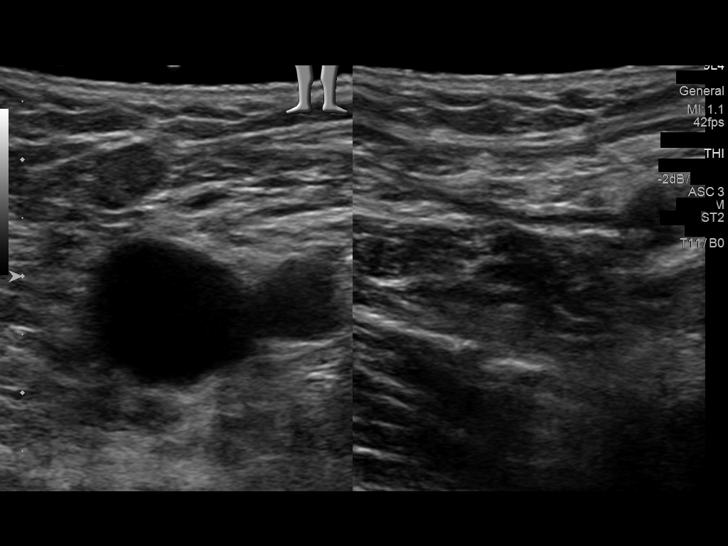
[im 4/38]
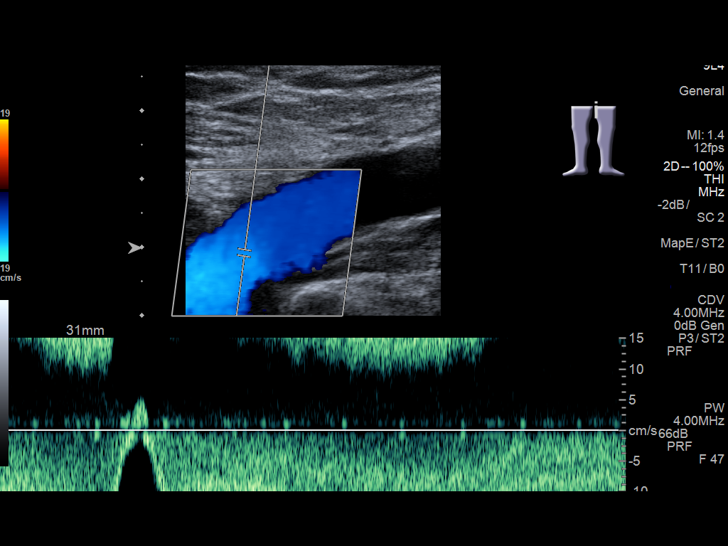
[im 7/38]
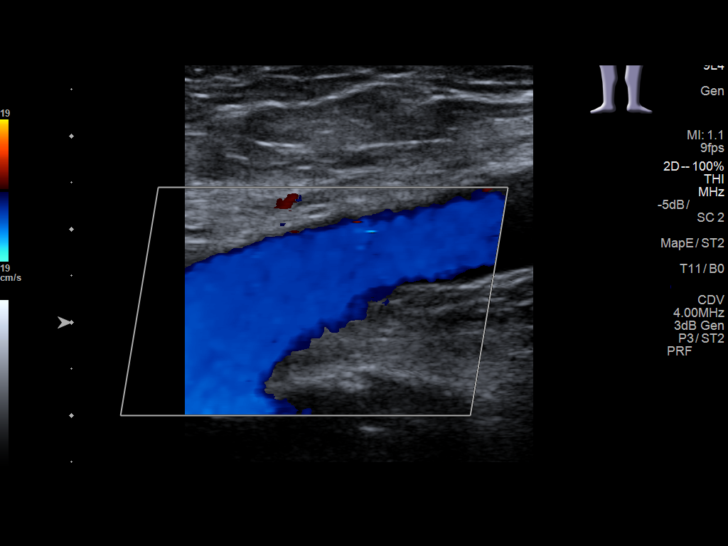
[im 10/38]
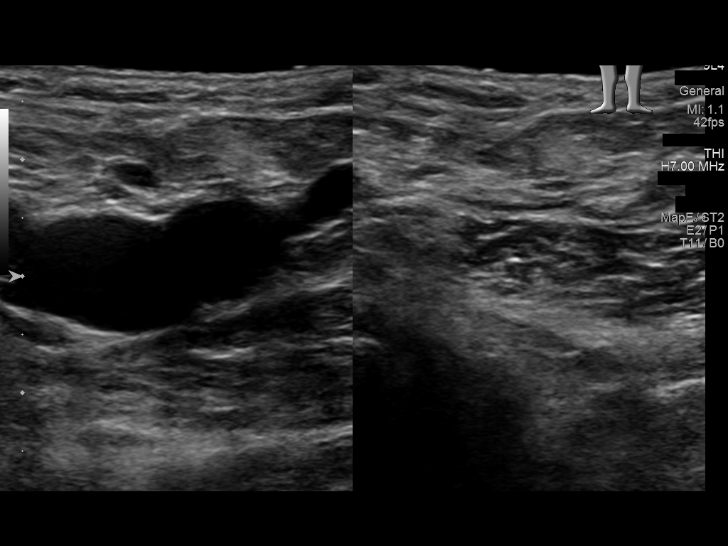
[im 13/38]
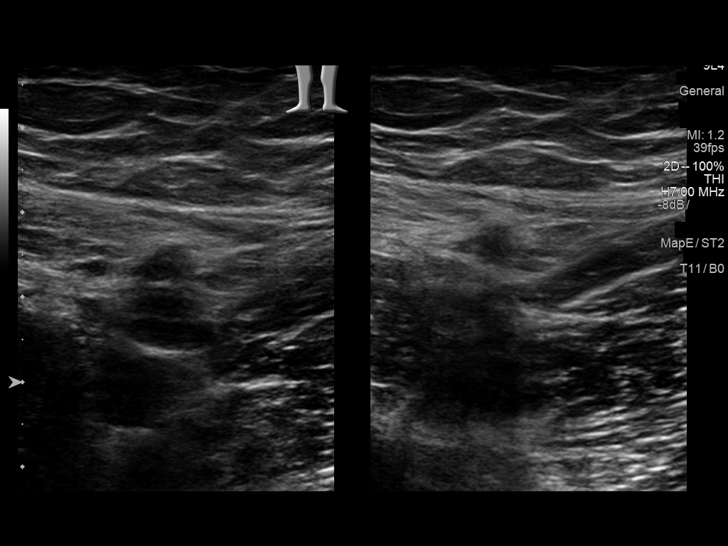
[im 17/38]
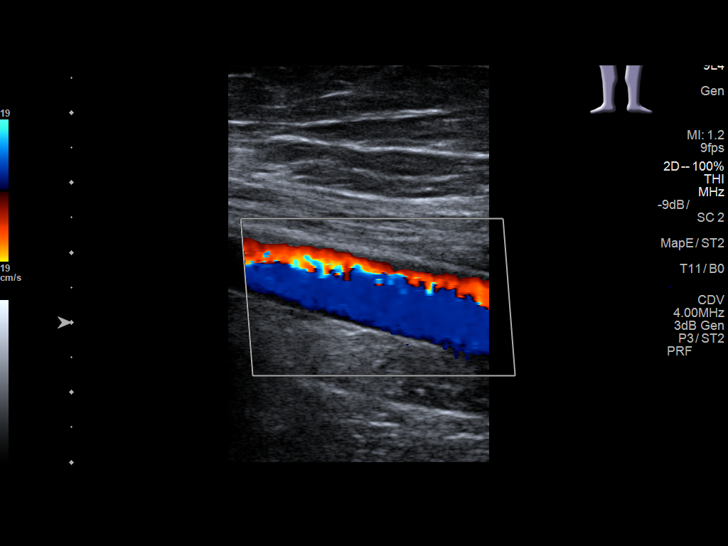
[im 20/38]
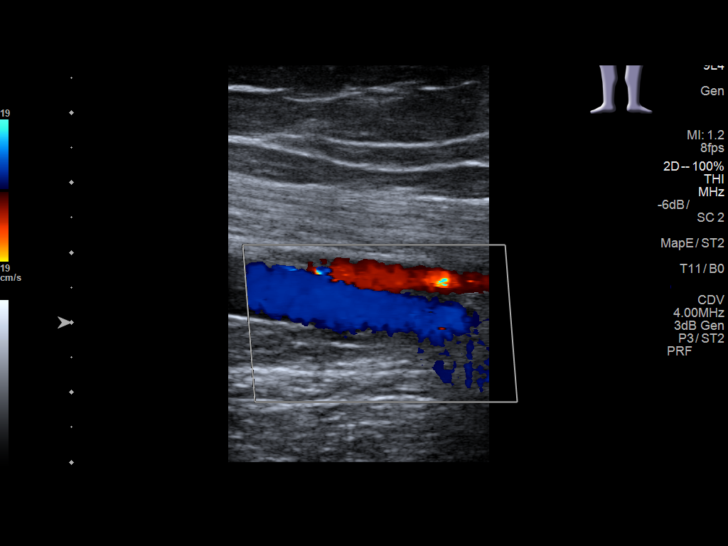
[im 21/38]
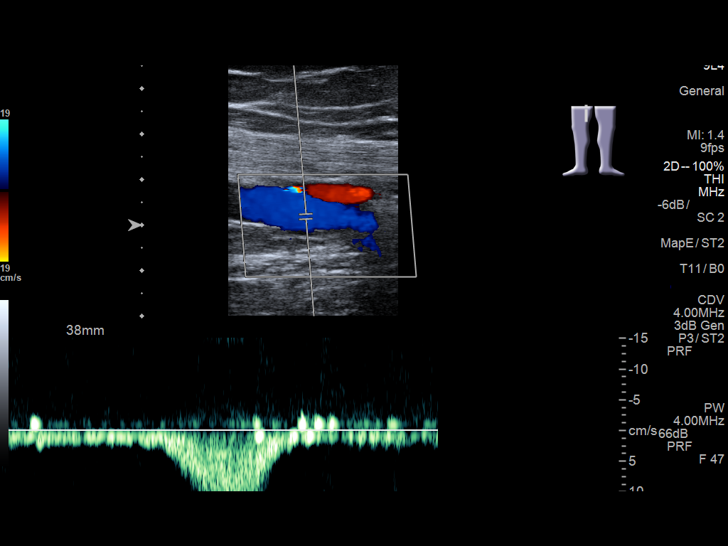
[im 25/38]
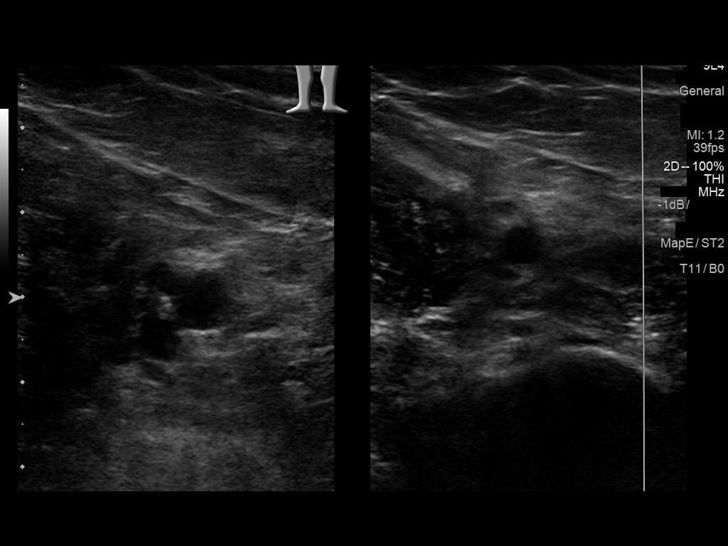
[im 28/38]
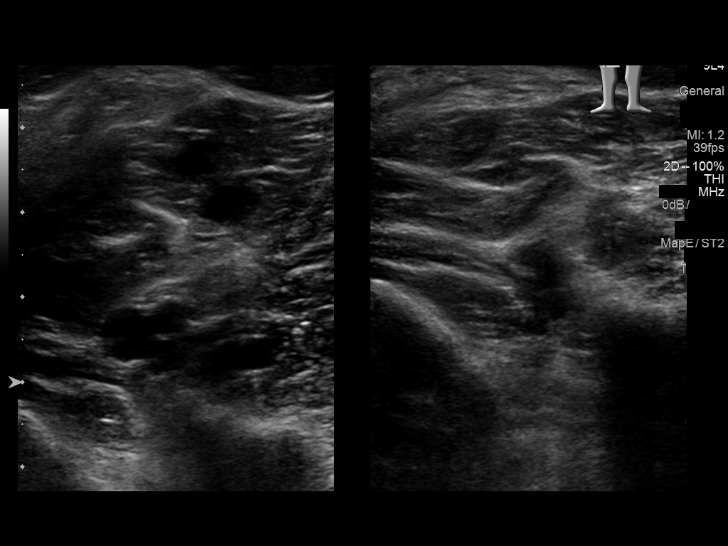
[im 31/38]
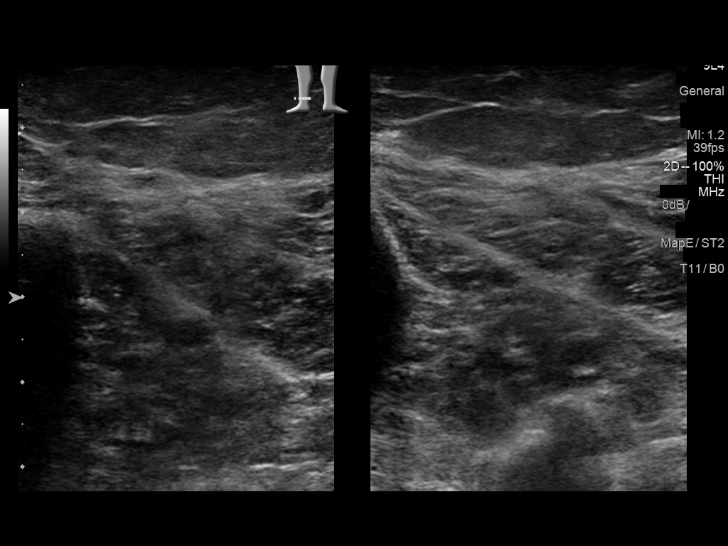
[im 34/38]
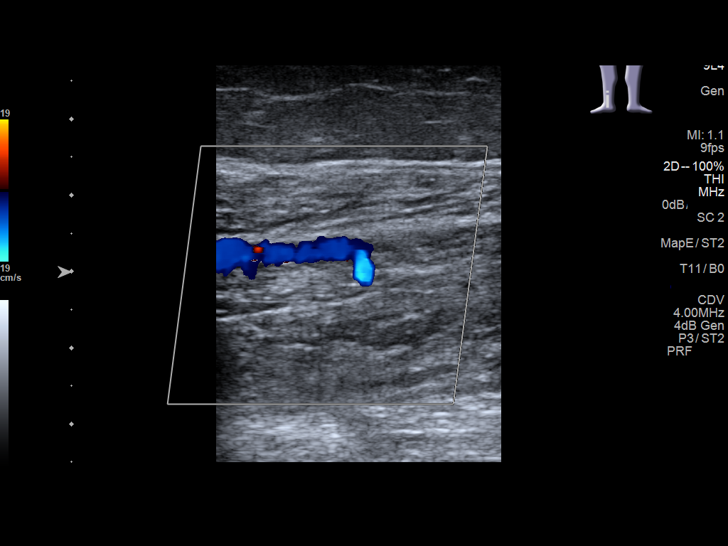
[im 38/38]
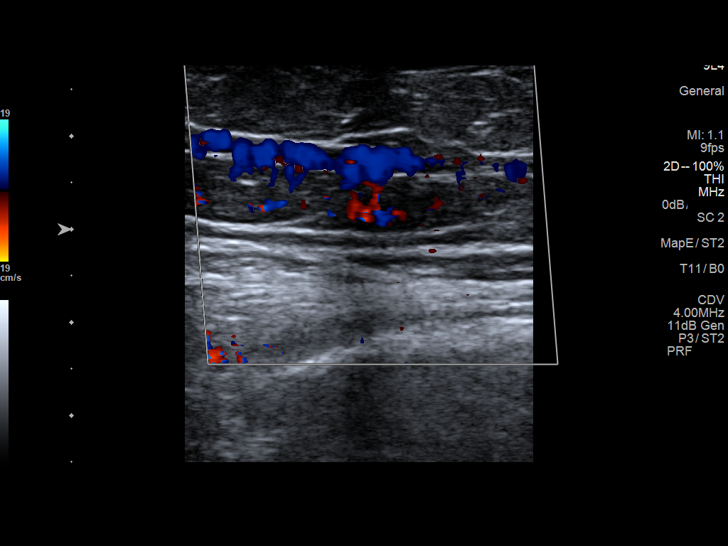

[13 of 24 positions shown; findings below may reference images not displayed]

FINDINGS: Contralateral Common Femoral Vein: Respiratory phasicity is normal
and symmetric with the symptomatic side. No evidence of thrombus.
Normal compressibility.

Common Femoral Vein: No evidence of thrombus. Normal
compressibility, respiratory phasicity and response to augmentation.

Saphenofemoral Junction: No evidence of thrombus. Normal
compressibility and flow on color Doppler imaging.

Profunda Femoral Vein: No evidence of thrombus. Normal
compressibility and flow on color Doppler imaging.

Femoral Vein: No evidence of thrombus. Normal compressibility,
respiratory phasicity and response to augmentation.

Popliteal Vein: No evidence of thrombus. Normal compressibility,
respiratory phasicity and response to augmentation.

Calf Veins: No evidence of thrombus. Normal compressibility and flow
on color Doppler imaging.

Superficial Great Saphenous Vein: No evidence of thrombus. Normal
compressibility and flow on color Doppler imaging.

Venous Reflux:  None.

Other Findings: No evidence of superficial thrombophlebitis or
abnormal fluid collection.
IMPRESSION: No evidence of right lower extremity deep venous thrombosis.

## 2018-05-19 ENCOUNTER — Encounter (INDEPENDENT_AMBULATORY_CARE_PROVIDER_SITE_OTHER): Payer: Self-pay

## 2018-05-22 ENCOUNTER — Telehealth (INDEPENDENT_AMBULATORY_CARE_PROVIDER_SITE_OTHER): Payer: Self-pay | Admitting: Vascular Surgery

## 2018-05-28 ENCOUNTER — Other Ambulatory Visit (INDEPENDENT_AMBULATORY_CARE_PROVIDER_SITE_OTHER): Payer: Self-pay | Admitting: Vascular Surgery

## 2018-05-28 ENCOUNTER — Other Ambulatory Visit: Payer: Self-pay

## 2018-05-28 ENCOUNTER — Encounter (INDEPENDENT_AMBULATORY_CARE_PROVIDER_SITE_OTHER): Payer: Self-pay | Admitting: Vascular Surgery

## 2018-05-28 ENCOUNTER — Ambulatory Visit (INDEPENDENT_AMBULATORY_CARE_PROVIDER_SITE_OTHER)

## 2018-05-28 ENCOUNTER — Ambulatory Visit (INDEPENDENT_AMBULATORY_CARE_PROVIDER_SITE_OTHER): Admitting: Vascular Surgery

## 2018-05-28 VITALS — BP 120/82 | HR 73 | Resp 17 | Ht 62.0 in | Wt 139.0 lb

## 2018-05-28 DIAGNOSIS — M79604 Pain in right leg: Secondary | ICD-10-CM

## 2018-05-28 DIAGNOSIS — I872 Venous insufficiency (chronic) (peripheral): Secondary | ICD-10-CM

## 2018-05-28 DIAGNOSIS — R600 Localized edema: Secondary | ICD-10-CM | POA: Diagnosis not present

## 2018-05-28 DIAGNOSIS — I83813 Varicose veins of bilateral lower extremities with pain: Secondary | ICD-10-CM

## 2018-05-28 NOTE — Progress Notes (Signed)
Subjective:    Patient ID: Andrea Adams, female    DOB: [birth year], 37 y.o.   MRN: [id number] Chief Complaint  Patient presents with  . Follow-up    ultrasound   Patient last seen on September 19, 2017 for evaluation of right lower extremity pain.  The patient presents today at her request with a chief complaint of continued right lower extremity discomfort.  The patient notes over the last "few weeks" the discomfort she has been experiencing to the right lower extremity has progressively worsened.  The patient does have a past medical history of endovenous laser ablation and follow-up sclerotherapy to the right lower extremity.  The patient does engage in conservative therapy including wearing medical grade one compression socks, elevating her legs and remaining active.  The patient denies any claudication-like symptoms, rest pain or ulcer formation to the bilateral lower extremity.  Conservative therapy has provided minimal relief.  The patient is concerned that she has a "blood clot".  Patient denies any erythema or worsening edema to the bilateral legs.  The patient underwent a bilateral lower extremity venous duplex to rule out DVT.  There is no evidence of deep vein or superficial thrombophlebitis.  Patient denies any shortness of breath or chest pain.  Patient denies any fever, nausea vomiting.  Review of Systems  Constitutional: Negative.   HENT: Negative.   Eyes: Negative.   Respiratory: Negative.   Cardiovascular:       Right Lower Extremity Pain  Gastrointestinal: Negative.   Endocrine: Negative.   Genitourinary: Negative.   Musculoskeletal: Negative.   Skin: Negative.   Allergic/Immunologic: Negative.   Neurological: Negative.   Hematological: Negative.   Psychiatric/Behavioral: Negative.       Objective:   Physical Exam  Constitutional: She is oriented to person, place, and time. She appears well-developed and well-nourished. No distress.  HENT:  Head: Normocephalic and  atraumatic.  Right Ear: External ear normal.  Left Ear: External ear normal.  Eyes: Pupils are equal, round, and reactive to light. Conjunctivae and EOM are normal.  Neck: Normal range of motion.  Cardiovascular: Normal rate, regular rhythm, normal heart sounds and intact distal pulses.  Pulses:      Radial pulses are 2+ on the right side, and 2+ on the left side.       Dorsalis pedis pulses are 2+ on the right side, and 2+ on the left side.       Posterior tibial pulses are 2+ on the right side, and 2+ on the left side.  Right lower extremity: Nontender to palpation.  No edema. No erythema.  Pulmonary/Chest: Effort normal and breath sounds normal.  Musculoskeletal: Normal range of motion. She exhibits no edema.  Neurological: She is alert and oriented to person, place, and time.  Skin: Skin is warm and dry. She is not diaphoretic.  Psychiatric: She has a normal mood and affect. Her behavior is normal. Judgment and thought content normal.  Vitals reviewed.  BP 120/82   Pulse 73   Resp 17   Ht 5\' 2"  (1.575 m)   Wt 139 lb (63 kg)   BMI 25.42 kg/m   Past Medical History:  Diagnosis Date  . Anxiety   . Depression   . Varicose veins of both lower extremities with pain    Social History   Socioeconomic History  . Marital status: Married    Spouse name: Not on file  . Number of children: Not on file  . Years of education: Not  on file  . Highest education level: Not on file  Occupational History  . Not on file  Social Needs  . Financial resource strain: Not on file  . Food insecurity:    Worry: Not on file    Inability: Not on file  . Transportation needs:    Medical: Not on file    Non-medical: Not on file  Tobacco Use  . Smoking status: Current Some Day Smoker    Types: Cigarettes  . Smokeless tobacco: Current User  Substance and Sexual Activity  . Alcohol use: Yes  . Drug use: No  . Sexual activity: Not on file  Lifestyle  . Physical activity:    Days per week:  Not on file    Minutes per session: Not on file  . Stress: Not on file  Relationships  . Social connections:    Talks on phone: Not on file    Gets together: Not on file    Attends religious service: Not on file    Active member of club or organization: Not on file    Attends meetings of clubs or organizations: Not on file    Relationship status: Not on file  . Intimate partner violence:    Fear of current or ex partner: Not on file    Emotionally abused: Not on file    Physically abused: Not on file    Forced sexual activity: Not on file  Other Topics Concern  . Not on file  Social History Narrative  . Not on file   Past Surgical History:  Procedure Laterality Date  . HERNIA REPAIR    . TONSILLECTOMY     Family History  Problem Relation Age of Onset  . Varicose Veins Mother   . Cancer Father   . AAA (abdominal aortic aneurysm) Father   . Cerebrovascular Accident Maternal Grandfather    No Known Allergies     Assessment & Plan:  Patient last seen on September 19, 2017 for evaluation of right lower extremity pain.  The patient presents today at her request with a chief complaint of continued right lower extremity discomfort.  The patient notes over the last "few weeks" the discomfort she has been experiencing to the right lower extremity has progressively worsened.  The patient does have a past medical history of endovenous laser ablation and follow-up sclerotherapy to the right lower extremity.  The patient does engage in conservative therapy including wearing medical grade one compression socks, elevating her legs and remaining active.  The patient denies any claudication-like symptoms, rest pain or ulcer formation to the bilateral lower extremity.  Conservative therapy has provided minimal relief.  The patient is concerned that she has a "blood clot".  Patient denies any erythema or worsening edema to the bilateral legs.  The patient underwent a bilateral lower extremity venous  duplex to rule out DVT.  There is no evidence of deep vein or superficial thrombophlebitis.  Patient denies any shortness of breath or chest pain.  Patient denies any fever, nausea vomiting.  1. Chronic venous insufficiency - Stable Patient underwent a bilateral venous duplex to rule out DVT Duplex was negative for deep vein thrombosis and superficial thrombophlebitis Patient should continue to engage in conservative therapy on daily basis I will bring her back and undergo a bilateral lower extremity venous duplex to rule out any worsening/new reflux, recannulation of her previously ablated great saphenous vein. The patient expresses her understanding  - VAS [place] LOWER EXTREMITY VENOUS REFLUX; Future  2. Varicose veins of bilateral lower extremities with pain - Stable As above  - VAS [place] LOWER EXTREMITY VENOUS REFLUX; Future  3. Pain of right lower extremity - Stable As above  - VAS US LOWER EXTREMITY VENOUS REFLUX; Future  Current Outpatient Medications on File Prior to Visit  Medication Sig Dispense Refill  . acetaminophen (TYLENOL) 500 MG tablet Take by mouth as needed.    . ALPRAZolam (XANAX) 0.25 MG tablet Take by mouth as needed.     Marland Kitchen. atorvastatin (LIPITOR) 10 MG tablet Take by mouth.    Marland Kitchen. buPROPion (WELLBUTRIN XL) 150 MG 24 hr tablet Take by mouth.    . busPIRone (BUSPAR) 5 MG tablet Take by mouth.    . Multiple Vitamin (MULTIVITAMIN) LIQD Take 5 mLs by mouth daily.    . propranolol (INDERAL) 20 MG tablet   1  . phentermine (ADIPEX-P) 37.5 MG tablet TK 1 T PO QD  0   No current facility-administered medications on file prior to visit.    There are no Patient Instructions on file for this visit. No follow-ups on file.  Kayln Garceau A Daizee Firmin, PA-C

## 2018-06-13 ENCOUNTER — Encounter (INDEPENDENT_AMBULATORY_CARE_PROVIDER_SITE_OTHER)

## 2018-06-13 ENCOUNTER — Ambulatory Visit (INDEPENDENT_AMBULATORY_CARE_PROVIDER_SITE_OTHER): Admitting: Vascular Surgery

## 2018-06-18 ENCOUNTER — Encounter (INDEPENDENT_AMBULATORY_CARE_PROVIDER_SITE_OTHER)

## 2018-06-18 ENCOUNTER — Ambulatory Visit (INDEPENDENT_AMBULATORY_CARE_PROVIDER_SITE_OTHER): Admitting: Vascular Surgery
